# Patient Record
Sex: Male | Born: 2005 | Race: Black or African American | Hispanic: No | Marital: Single | Smoking: Never smoker
Health system: Southern US, Community
[De-identification: ages and names within clinical notes are randomized; demographics above are authoritative.]

---
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
## 2024-01-05 DIAGNOSIS — Z008 Encounter for other general examination: Secondary | ICD-10-CM | POA: Diagnosis not present

## 2024-01-05 DIAGNOSIS — L638 Other alopecia areata: Secondary | ICD-10-CM | POA: Diagnosis not present

## 2024-01-05 DIAGNOSIS — Z0189 Encounter for other specified special examinations: Secondary | ICD-10-CM | POA: Diagnosis not present

## 2024-03-02 ENCOUNTER — Ambulatory Visit: Admitting: Orthopedic Surgery

## 2024-03-02 ENCOUNTER — Other Ambulatory Visit: Payer: Self-pay

## 2024-03-02 ENCOUNTER — Telehealth: Payer: Self-pay | Admitting: Orthopedic Surgery

## 2024-03-02 VITALS — Ht 75.0 in | Wt 179.0 lb

## 2024-03-02 DIAGNOSIS — M25511 Pain in right shoulder: Secondary | ICD-10-CM

## 2024-03-02 DIAGNOSIS — M24411 Recurrent dislocation, right shoulder: Secondary | ICD-10-CM

## 2024-03-02 NOTE — Patient Instructions (Signed)
 While we are working on your approval for MRI please go ahead and call to schedule your appointment with [facility] within at least one (1) week.   Central Scheduling [phone]

## 2024-03-02 NOTE — Progress Notes (Signed)
" °  Intake history:  Chief Complaint  Patient presents with   Shoulder Pain    Right- DOI 02/23/24 weightlifting popped out of place I went to grab it and it went back in place      There were no vitals taken for this visit. There is no height or weight on file to calculate BMI.  Pharmacy? ____[facility]__________________________________  WHAT ARE WE SEEING YOU FOR TODAY?   right shoulder  How Castella Lerner has this bothered you? (DOI?DOS?WS?)  on 02/23/24  Was there an injury? Yes  Anticoag.  No   Any ALLERGIES ______________________________________________   Treatment:  Have you taken:  Tylenol  No  Advil  Yes  Had PT No  Had injection No  Other  ______________ice___________     "

## 2024-03-02 NOTE — Telephone Encounter (Signed)
 noted

## 2024-03-02 NOTE — Telephone Encounter (Signed)
 Patient may go to [facility] for MRI he has number to schedule  He is in school though and if he can't get here he has a written order to get it done external, he will call to let us  know where he goes if not [place] so it will not interfere with the approval  To you FYI

## 2024-03-02 NOTE — Progress Notes (Signed)
 "  Office Visit Note   Patient: Darren Weeks           Date of Birth: Feb 22, 2005           MRN: [id number] Visit Date: 03/02/2024 Requested by: Cook, Jayce G, DO [address] [place],  [place] [address] PCP: Cook, Jayce G, DO   Assessment & Plan:   Encounter Diagnoses  Name Primary?   Acute pain of right shoulder Yes   Recurrent shoulder dislocation, right     No orders of the defined types were placed in this encounter.   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  He actually had pain and a subluxation type episode while holding the benchpress above his chest area.  The shoulder popped someone was able to take the weight form from him and he popped the shoulder back and  He feels like something is not right in his shoulder              ROS: Negative   Images personally read and my interpretation : Images were obtained  Visit Diagnoses:  1. Acute pain of right shoulder   2. Recurrent shoulder dislocation, right      Follow-Up Instructions: No follow-ups on file.    Objective: Vital Signs: Ht 6' 3 (1.905 m)   Wt 179 lb (81.2 kg)   BMI 22.37 kg/m   Physical Exam Vitals and nursing note reviewed.  Constitutional:      Appearance: Normal  appearance.  HENT:     Head: Normocephalic and atraumatic.  Eyes:     General: No scleral icterus.       Right eye: No discharge.        Left eye: No discharge.     Extraocular Movements: Extraocular movements intact.     Conjunctiva/sclera: Conjunctivae normal.     Pupils: Pupils are equal, round, and reactive to light.  Cardiovascular:     Rate and Rhythm: Normal rate.     Pulses: Normal pulses.  Skin:    General: Skin is warm and dry.     Capillary Refill: Capillary refill takes less than 2 seconds.  Neurological:     General: No focal deficit present.     Mental Status: He is alert and oriented to person, place, and time.  Psychiatric:        Mood and Affect: Mood normal.        Behavior: Behavior normal.        Thought Content: Thought content normal.        Judgment: Judgment normal.      Right Shoulder Exam   Comments:  Right shoulder  Skin is intact no lacerations or previous scars no atrophy  Tender  posterior joint line otherwise the remaining portions of the shoulder were nontender  He had full range of motion  Load-and-shift test pain posteriorly mild apprehension click noted in the front of the shoulder  Rotator cuff strength normal  Some pain with abduction 90 degrees and internal rotation mostly in the posterior part of the shoulder joint       Specialty Comments:  No specialty comments available.  Imaging: DG Shoulder Right Result Date: 03/02/2024 Image report 3 views right shoulder Humeral head and glenohumeral joint look normal Suboptimal axillary view but no obvious Hill-Sachs lesion Normal x-ray no fracture and no Hill-Sachs lesion     PMFS History: Patient Active Problem List   Diagnosis Date Noted   Alopecia 08/18/2023   Annual physical exam 07/12/2022   No past medical history on file.  No family history on file.  Past Surgical History:  Procedure Laterality Date   SHOULDER ARTHROSCOPY WITH LABRAL REPAIR Left 03/27/2020    Procedure: SHOULDER ARTHROSCOPY WITH LABRAL REPAIR AND CAPSULAR REPAIR;  Surgeon: Onesimo Oneil LABOR, MD;  Location: [facility];  Service: Orthopedics;  Laterality: Left;   Social History   Occupational History   Not on file  Tobacco Use   Smoking status: Never   Smokeless tobacco: Never  Vaping Use   Vaping status: Never Used  Substance and Sexual Activity   Alcohol use: Never   Drug use: Never   Sexual activity: Yes    Comment: same male partner for 5 years       "

## 2024-03-09 ENCOUNTER — Ambulatory Visit (HOSPITAL_COMMUNITY)
